# Patient Record
Sex: Female | Born: 1990 | Race: White | Hispanic: No | Marital: Single | Smoking: Never smoker
Health system: Southern US, Community
[De-identification: ages and names within clinical notes are randomized; demographics above are authoritative.]

## PROBLEM LIST (undated history)

## (undated) ENCOUNTER — Inpatient Hospital Stay (HOSPITAL_COMMUNITY): Payer: Self-pay

## (undated) DIAGNOSIS — Z789 Other specified health status: Secondary | ICD-10-CM

---

## 2009-07-12 ENCOUNTER — Ambulatory Visit (HOSPITAL_COMMUNITY): Admission: RE | Admit: 2009-07-12 | Discharge: 2009-07-12 | Payer: Self-pay | Admitting: Obstetrics and Gynecology

## 2009-08-09 ENCOUNTER — Ambulatory Visit (HOSPITAL_COMMUNITY): Admission: RE | Admit: 2009-08-09 | Discharge: 2009-08-09 | Payer: Self-pay | Admitting: Obstetrics and Gynecology

## 2009-09-20 ENCOUNTER — Ambulatory Visit (HOSPITAL_COMMUNITY): Admission: RE | Admit: 2009-09-20 | Discharge: 2009-09-20 | Payer: Self-pay | Admitting: Obstetrics and Gynecology

## 2011-11-06 IMAGING — US US OB DETAIL+14 WK
1 series · 18 of 28 positions shown · non-contrast
Comparison: none

OBSTETRICAL ULTRASOUND:
 This ultrasound was performed in The [HOSPITAL], and the [facility] report will be stored to [REDACTED] PACS.  This report is also available in [HOSPITAL]?s accessANYware.

[Series 1: us ob detail+14 wk · 87 acquisitions, 18 frames shown]
[im 1/87]
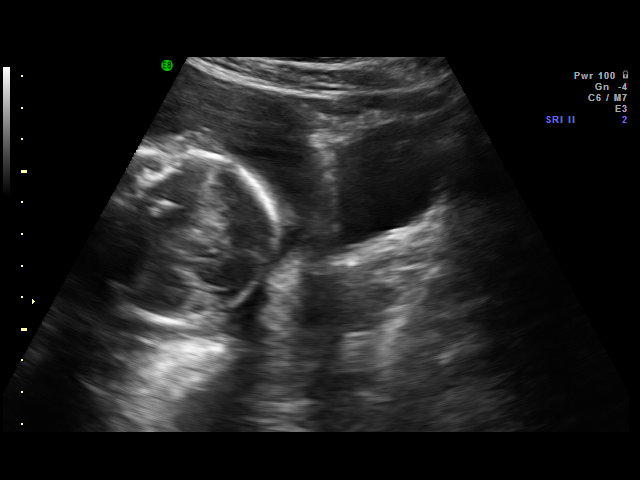
[im 7/87]
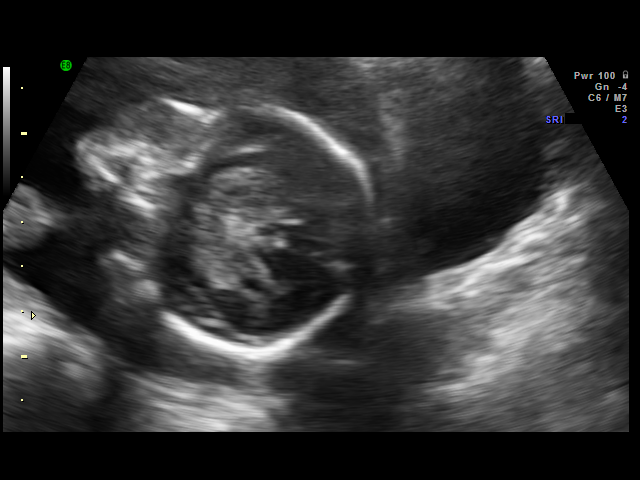
[im 10/87]
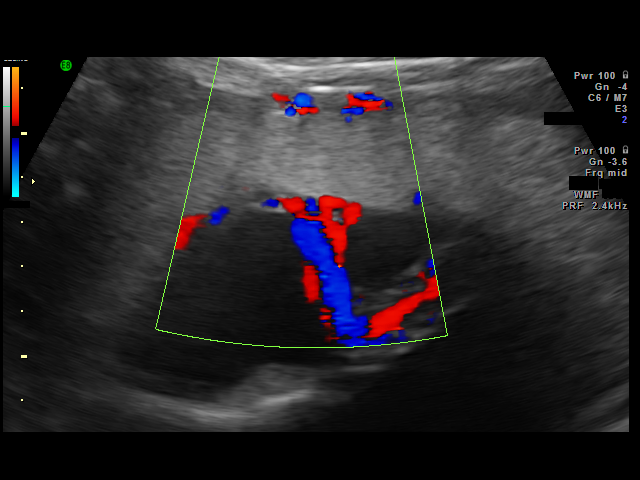
[im 16/87]
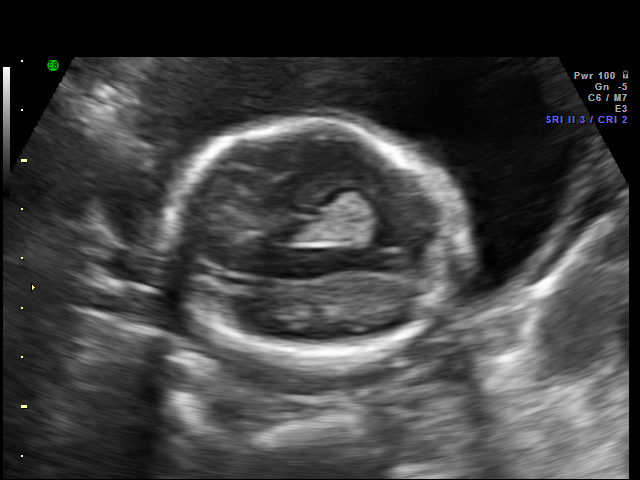
[im 23/87]
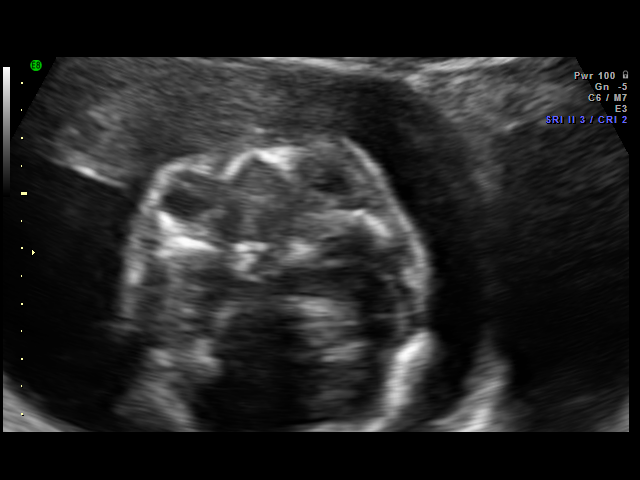
[im 26/87]
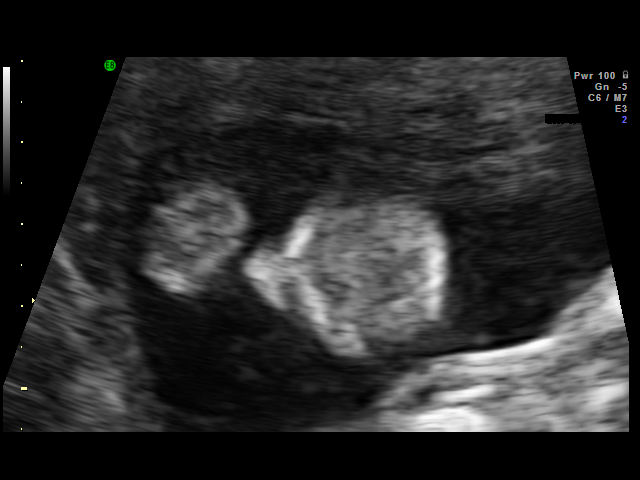
[im 32/87]
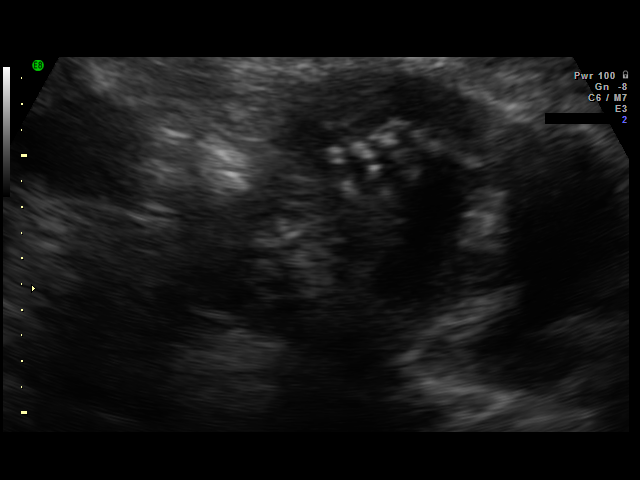
[im 36/87]
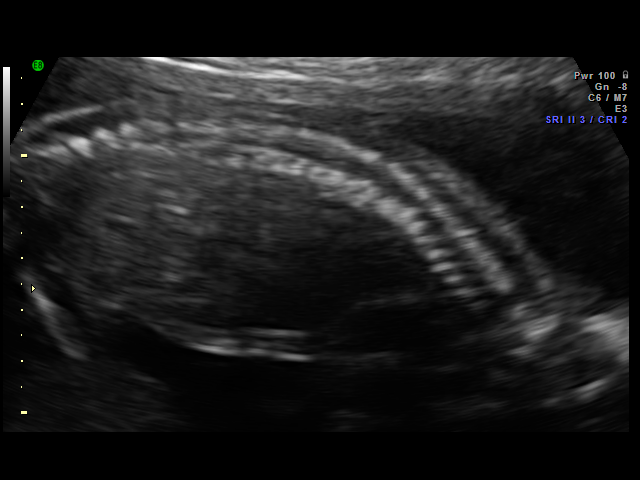
[im 42/87]
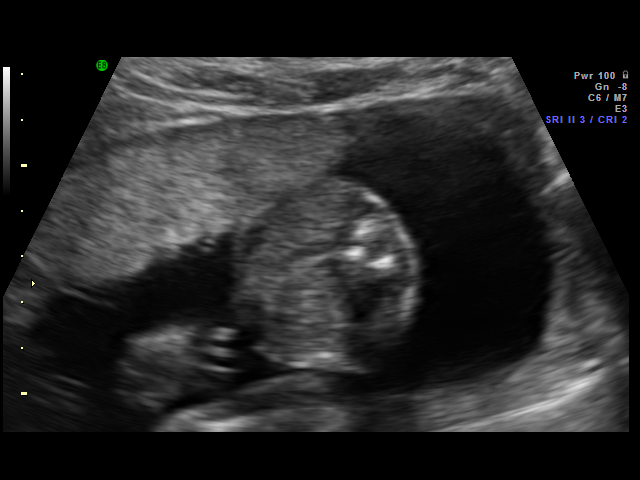
[im 45/87]
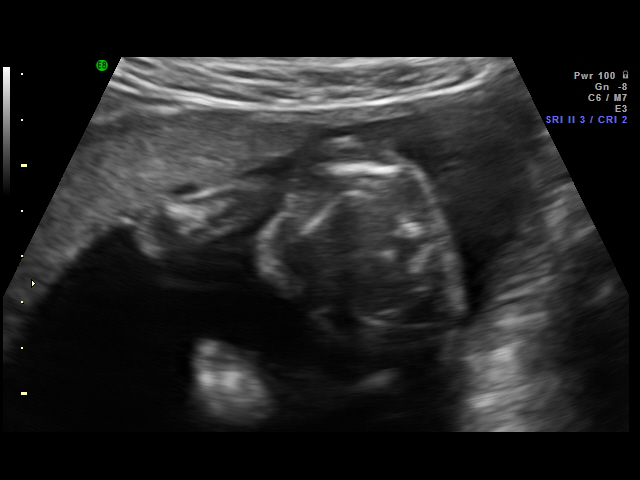
[im 51/87]
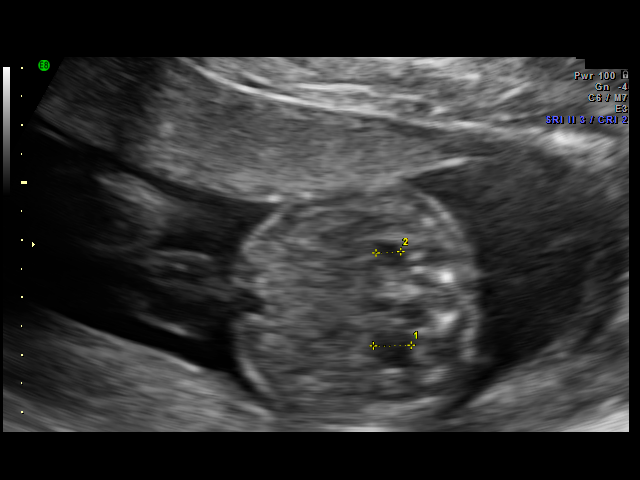
[im 55/87]
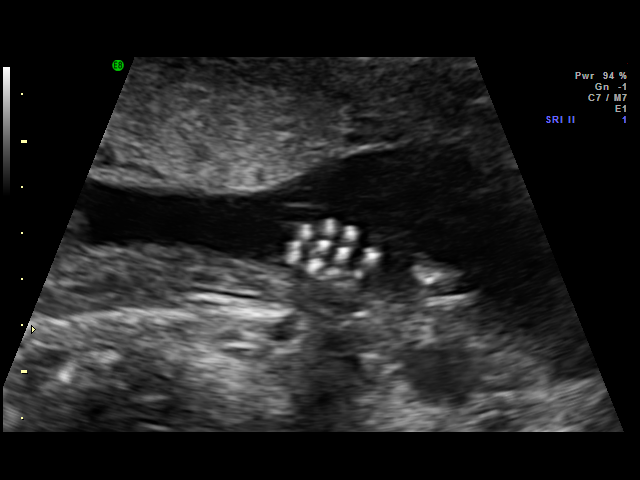
[im 61/87]
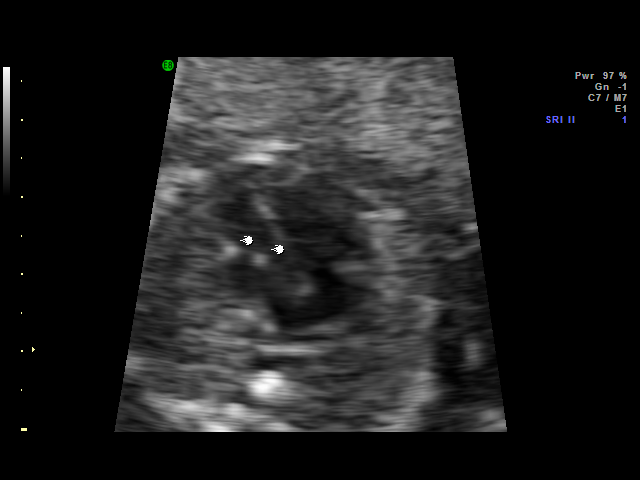
[im 67/87]
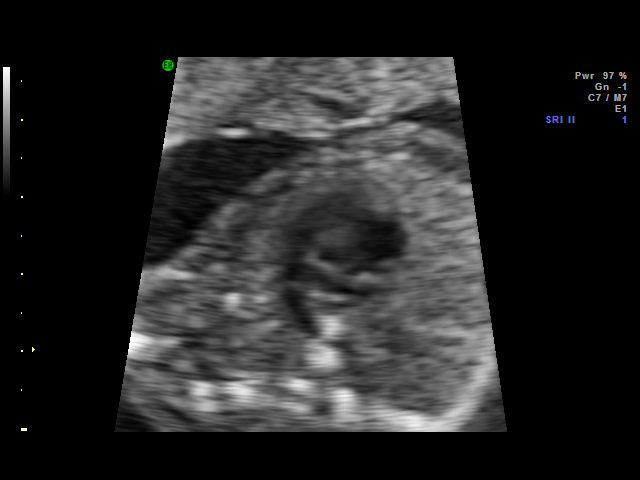
[im 71/87]
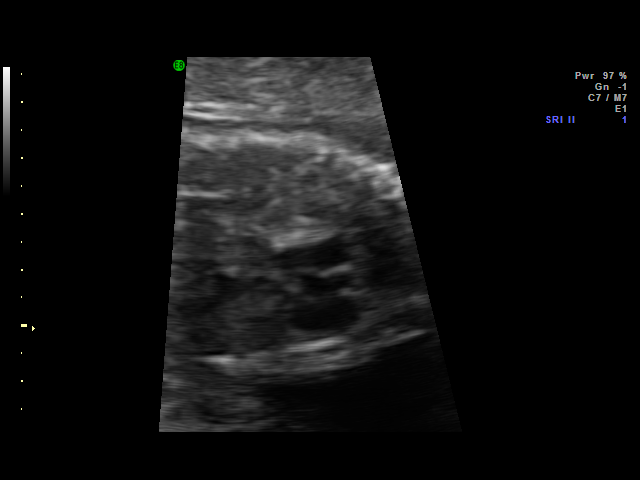
[im 77/87]
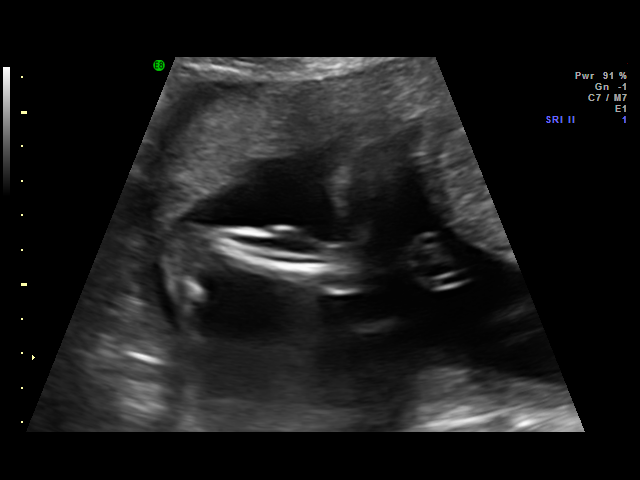
[im 80/87]
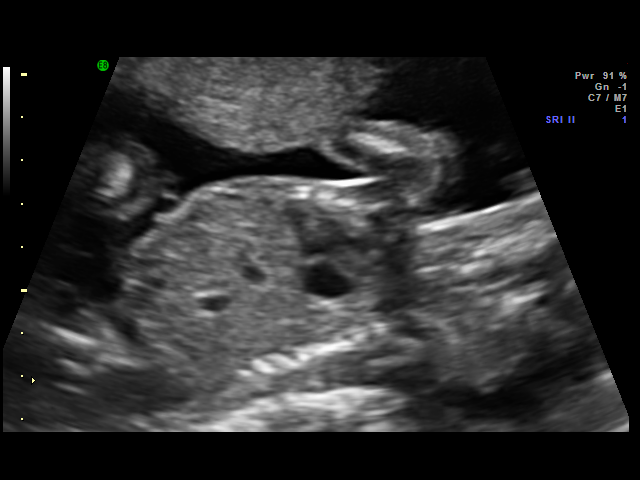
[im 87/87]
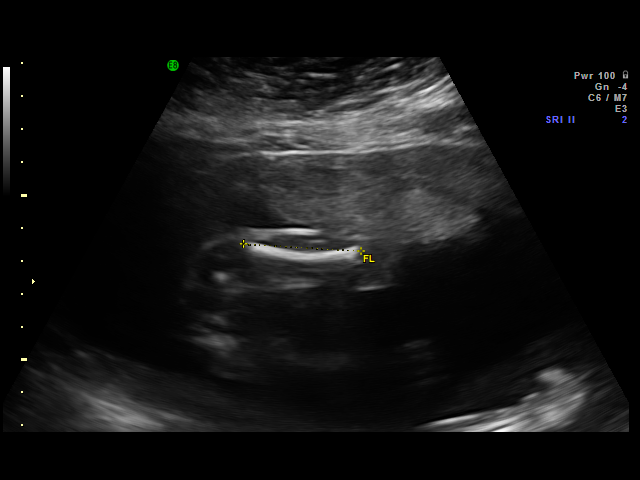

[18 of 28 positions shown; findings below may reference images not displayed]

IMPRESSION: [facility] has also been faxed to the ordering physician.

## 2015-10-18 ENCOUNTER — Encounter (HOSPITAL_COMMUNITY): Payer: Self-pay | Admitting: *Deleted

## 2015-10-18 ENCOUNTER — Inpatient Hospital Stay (HOSPITAL_COMMUNITY)
Admission: AD | Admit: 2015-10-18 | Discharge: 2015-10-18 | Disposition: A | Discharge status: Home / Self Care | Payer: BLUE CROSS/BLUE SHIELD | Source: Ambulatory Visit | Attending: Obstetrics and Gynecology | Admitting: Obstetrics and Gynecology

## 2015-10-18 DIAGNOSIS — Z3A16 16 weeks gestation of pregnancy: Secondary | ICD-10-CM | POA: Insufficient documentation

## 2015-10-18 DIAGNOSIS — R000 Tachycardia, unspecified: Secondary | ICD-10-CM | POA: Diagnosis not present

## 2015-10-18 DIAGNOSIS — I498 Other specified cardiac arrhythmias: Secondary | ICD-10-CM | POA: Insufficient documentation

## 2015-10-18 DIAGNOSIS — R42 Dizziness and giddiness: Secondary | ICD-10-CM | POA: Insufficient documentation

## 2015-10-18 DIAGNOSIS — O99412 Diseases of the circulatory system complicating pregnancy, second trimester: Secondary | ICD-10-CM | POA: Diagnosis not present

## 2015-10-18 DIAGNOSIS — O26892 Other specified pregnancy related conditions, second trimester: Secondary | ICD-10-CM | POA: Diagnosis not present

## 2015-10-18 HISTORY — DX: Other specified health status: Z78.9

## 2015-10-18 LAB — URINALYSIS, ROUTINE W REFLEX MICROSCOPIC
Bilirubin Urine: NEGATIVE
GLUCOSE, UA: NEGATIVE mg/dL
Hgb urine dipstick: NEGATIVE
KETONES UR: NEGATIVE mg/dL
Nitrite: NEGATIVE
PH: 6 (ref 5.0–8.0)
Protein, ur: NEGATIVE mg/dL
Specific Gravity, Urine: <1.005 — ABNORMAL LOW (ref 1.005–1.030)

## 2015-10-18 LAB — TSH: TSH: 1.242 u[IU]/mL (ref 0.350–4.500)

## 2015-10-18 LAB — CBC
HEMATOCRIT: 34.1 % — AB (ref 36.0–46.0)
HEMOGLOBIN: 11.7 g/dL — AB (ref 12.0–15.0)
MCH: 27.5 pg (ref 26.0–34.0)
MCHC: 34.3 g/dL (ref 30.0–36.0)
MCV: 80.2 fL (ref 78.0–100.0)
Platelets: 262 10*3/uL (ref 150–400)
RBC: 4.25 MIL/uL (ref 3.87–5.11)
RDW: 13.7 % (ref 11.5–15.5)
WBC: 9.5 10*3/uL (ref 4.0–10.5)

## 2015-10-18 LAB — T4, FREE: Free T4: 0.69 ng/dL (ref 0.61–1.12)

## 2015-10-18 LAB — URINE MICROSCOPIC-ADD ON
BACTERIA UA: NONE SEEN
RBC / HPF: NONE SEEN RBC/hpf (ref 0–5)

## 2015-10-18 NOTE — MAU Provider Note (Signed)
Chief Complaint:  Tachycardia   First Provider Initiated Contact with Patient 10/18/15 1205     HPI: Whitney Sherman is a 25 y.o. G3P1011 at 2416w1dwho presents to maternity admissions reporting rapid heart rate while at work   Her coworkers put on a pulse oximeter (ortho MD office) and found her rate to be 218.  They sent her here.  No loss of consciousness, shortness of breath or chest pain.  Did feel a little dizzy. She reports good fetal movement, denies LOF, vaginal bleeding, vaginal itching/burning, urinary symptoms, h/a, dizziness, n/v, diarrhea, constipation or fever/chills.  She denies headache, visual changes or RUQ abdominal pain.  Palpitations   This is a recurrent (States this happens every now and then.) problem. The current episode started today. The problem occurs constantly. The problem has been rapidly improving (Improved after valsalva maneuver). Nothing aggravates the symptoms. Associated symptoms include dizziness. Pertinent negatives include no anxiety, chest pain, irregular heartbeat, malaise/fatigue, nausea, shortness of breath, syncope, vomiting or weakness. Treatments tried: Valsalva done here in triage. The treatment provided significant relief. There are no known risk factors. There is no history of heart disease.   RN Note: Pt's heart rate has been around 220 for 45 min now.  Pt  States had something like this a couple years ago, had an EKG- ? PVC's. No further work up.  o2Sat applied in triage.  Pulse initially 224. Had pt perform valsalva maneuver.. Pulse came down to 80's.  Feeling slightly dizzy, more shaky than anything else  Past Medical History: Past Medical History:  Diagnosis Date  . Medical history non-contributory     Past obstetric history: OB History  Gravida Para Term Preterm AB Living  3 1 1   1 1   SAB TAB Ectopic Multiple Live Births  1       1    # Outcome Date GA Lbr Len/2nd Weight Sex Delivery Anes PTL Lv  3 Current           2 Term      Vag-Spont    LIV  1 SAB               Past Surgical History: History reviewed. No pertinent surgical history.  Family History: History reviewed. No pertinent family history.  Social History: Social History  Substance Use Topics  . Smoking status: Never Smoker  . Smokeless tobacco: Never Used  . Alcohol use No     Comment: occ when not pregnant    Allergies: Allergies not on file  Meds:  No prescriptions prior to admission.    I have reviewed patient's Past Medical Hx, Surgical Hx, Family Hx, Social Hx, medications and allergies.   ROS:  Review of Systems  Constitutional: Negative for malaise/fatigue.  Respiratory: Negative for shortness of breath.   Cardiovascular: Positive for palpitations. Negative for chest pain and syncope.  Gastrointestinal: Negative for nausea and vomiting.  Neurological: Positive for dizziness. Negative for weakness.  Psychiatric/Behavioral: The patient is not nervous/anxious.    Other systems negative  Physical Exam  Patient Vitals for the past 24 hrs:  BP Temp Temp src Pulse Resp SpO2  10/18/15 1200 - - - 110 - 100 %  10/18/15 1143 - - - 97 - -  10/18/15 1131 114/77 98 F (36.7 C) Oral 99 20 99 %  10/18/15 1127 - - - - - 99 %   Constitutional: Well-developed, well-nourished female in no acute distress, but initially in triage, felt uncomfortable and a little dizzy  Cardiovascular: normal rate and rhythm, rate now in 80-90s Respiratory: normal effort, clear to auscultation bilaterally GI: Abd soft, non-tender, gravid appropriate for gestational age.   No rebound or guarding. MS: Extremities nontender, no edema, normal ROM Neurologic: Alert and oriented x 4.  GU: Neg CVAT.  FHR 150 by doppler  Labs: Results for orders placed or performed during the hospital encounter of 10/18/15 (from the past 72 hour(s))  Urinalysis, Routine w reflex microscopic (not at [facility])     Status: Abnormal   Collection Time: 10/18/15 11:33 AM  Result Value Ref Range    Color, Urine STRAW (A) YELLOW   APPearance CLEAR CLEAR   Specific Gravity, Urine <1.005 (L) 1.005 - 1.030   pH 6.0 5.0 - 8.0   Glucose, UA NEGATIVE NEGATIVE mg/dL   Hgb urine dipstick NEGATIVE NEGATIVE   Bilirubin Urine NEGATIVE NEGATIVE   Ketones, ur NEGATIVE NEGATIVE mg/dL   Protein, ur NEGATIVE NEGATIVE mg/dL   Nitrite NEGATIVE NEGATIVE   Leukocytes, UA MODERATE (A) NEGATIVE  Urine microscopic-add on     Status: Abnormal   Collection Time: 10/18/15 11:33 AM  Result Value Ref Range   Squamous Epithelial / LPF 6-30 (A) NONE SEEN   WBC, UA 0-5 0 - 5 WBC/hpf   RBC / HPF NONE SEEN 0 - 5 RBC/hpf   Bacteria, UA NONE SEEN NONE SEEN  CBC     Status: Abnormal   Collection Time: 10/18/15 12:12 PM  Result Value Ref Range   WBC 9.5 4.0 - 10.5 K/uL   RBC 4.25 3.87 - 5.11 MIL/uL   Hemoglobin 11.7 (L) 12.0 - 15.0 g/dL   HCT 16.1 (L) 09.6 - 04.5 %   MCV 80.2 78.0 - 100.0 fL   MCH 27.5 26.0 - 34.0 pg   MCHC 34.3 30.0 - 36.0 g/dL   RDW 40.9 81.1 - 91.4 %   Platelets 262 150 - 400 K/uL  TSH     Status: None   Collection Time: 10/18/15 12:12 PM  Result Value Ref Range   TSH 1.242 0.350 - 4.500 uIU/mL    Comment: Performed at [facility]  T4, free     Status: None   Collection Time: 10/18/15 12:12 PM  Result Value Ref Range   Free T4 0.69 0.61 - 1.12 ng/dL    Comment: (NOTE) Biotin ingestion may interfere with free T4 tests. If the results are inconsistent with the TSH level, previous test results, or the clinical presentation, then consider biotin interference. If needed, order repeat testing after stopping biotin. Performed at [facility]   T3, free     Status: None   Collection Time: 10/18/15 12:13 PM  Result Value Ref Range   T3, Free 3.6 2.0 - 4.4 pg/mL    Comment: (NOTE) Performed At: [facility] [address] [place], [place] [id number] Mila Homer MD YQ:[id number]     Imaging:  Sinus rhythm with sinus arrhythmia, rate 86 Incomplete  Right bundle branch block  I called Dr Jens Som (on call Cardiologist) who states the EKG is non-concerning at this time.  He recommends that if she ever has it again, to go to an ED which can immediately put her on a rhythm strip or EKG monitor to trace the rhythm on paper for diagnosis, before having her do Valsalva.   He offered to see her in his office for follow up visit.  MAU Course/MDM: I have ordered labs and reviewed results.  NST reviewed Consult Dr Estanislado Pandy  with presentation, exam findings and test results. She recommends doing CBC and Thyroid studies.  Will plan outpatient Cardiology consult with Dr Jens Somrenshaw. Treatments in MAU included Valsalva maneuver, 12 lead EKG.    Assessment: SIUP at 8616w1d Tachycardia, now resolved Sinus rhythm with sinus arrhythmia  Plan: Discharge home Follow up in Office for prenatal visits and recheck of heartbeat Office contacted, will arrange Cardiology appointmen    Medication List    You have not been prescribed any medications.    Pt stable at time of discharge.  Encouraged to return here or to other Urgent Care/ED if she develops worsening of symptoms, increase in pain, fever, or other concerning symptoms.    Wynelle BourgeoisMarie Derric Dealmeida CNM, MSN Certified Nurse-Midwife 10/18/2015 12:05 PM

## 2015-10-18 NOTE — Discharge Instructions (Signed)
Nonspecific Tachycardia Tachycardia is a faster than normal heartbeat (more than 100 beats per minute). In adults, the heart normally beats between 60 and 100 times a minute. A fast heartbeat may be a normal response to exercise or stress. It does not necessarily mean that something is wrong. However, sometimes when your heart beats too fast it may not be able to pump enough blood to the rest of your body. This can result in chest pain, shortness of breath, dizziness, and even fainting. Nonspecific tachycardia means that the specific cause or pattern of your tachycardia is unknown. CAUSES  Tachycardia may be harmless or it may be due to a more serious underlying cause. Possible causes of tachycardia include:  Exercise or exertion.  Fever.  Pain or injury.  Infection.  Loss of body fluids (dehydration).  Overactive thyroid.  Lack of red blood cells (anemia).  Anxiety and stress.  Alcohol.  Caffeine.  Tobacco products.  Diet pills.  Illegal drugs.  Heart disease. SYMPTOMS  Rapid or irregular heartbeat (palpitations).  Suddenly feeling your heart beating (cardiac awareness).  Dizziness.  Tiredness (fatigue).  Shortness of breath.  Chest pain.  Nausea.  Fainting. DIAGNOSIS  Your caregiver will perform a physical exam and take your medical history. In some cases, a heart specialist (cardiologist) may be consulted. Your caregiver may also order:  Blood tests.  Electrocardiography. This test records the electrical activity of your heart.  A heart monitoring test. TREATMENT  Treatment will depend on the likely cause of your tachycardia. The goal is to treat the underlying cause of your tachycardia. Treatment methods may include:  Replacement of fluids or blood through an intravenous (IV) tube for moderate to severe dehydration or anemia.  New medicines or changes in your current medicines.  Diet and lifestyle changes.  Treatment for certain  infections.  Stress relief or relaxation methods. HOME CARE INSTRUCTIONS   Rest.  Drink enough fluids to keep your urine clear or pale yellow.  Do not smoke.  Avoid:  Caffeine.  Tobacco.  Alcohol.  Chocolate.  Stimulants such as over-the-counter diet pills or pills that help you stay awake.  Situations that cause anxiety or stress.  Illegal drugs such as marijuana, phencyclidine (PCP), and cocaine.  Only take medicine as directed by your caregiver.  Keep all follow-up appointments as directed by your caregiver. SEEK IMMEDIATE MEDICAL CARE IF:   You have pain in your chest, upper arms, jaw, or neck.  You become weak, dizzy, or feel faint.  You have palpitations that will not go away.  You vomit, have diarrhea, or pass blood in your stool.  Your skin is cool, pale, and wet.  You have a fever that will not go away with rest, fluids, and medicine. MAKE SURE YOU:   Understand these instructions.  Will watch your condition.  Will get help right away if you are not doing well or get worse.   This information is not intended to replace advice given to you by your health care provider. Make sure you discuss any questions you have with your health care provider.   Document Released: 03/22/2004 Document Revised: 05/07/2011 Document Reviewed: 08/27/2014 Elsevier Interactive Patient Education 2016 [employer]  

## 2015-10-18 NOTE — MAU Note (Signed)
Pt's heart rate has been around 220 for 45 min now.  Pt  States had something like this a couple years ago, had an EKG- ? PVC's. No further work up.  o2Sat applied in triage.  Pulse initially 224. Had pt perform valsalva maneuver.. Pulse came down to 80's.  Feeling slightly dizzy, more shaky than anything else

## 2015-10-19 LAB — T3, FREE: T3 FREE: 3.6 pg/mL (ref 2.0–4.4)

## 2016-08-22 ENCOUNTER — Encounter (HOSPITAL_COMMUNITY): Payer: Self-pay
